# Patient Record
Sex: Male | Born: 1970 | Race: Black or African American | Hispanic: No | Marital: Married | Smoking: Never smoker
Health system: Southern US, Community
[De-identification: ages and names within clinical notes are randomized; demographics above are authoritative.]

## PROBLEM LIST (undated history)

## (undated) DIAGNOSIS — G4730 Sleep apnea, unspecified: Secondary | ICD-10-CM

## (undated) DIAGNOSIS — I10 Essential (primary) hypertension: Secondary | ICD-10-CM

---

## 2017-05-23 ENCOUNTER — Encounter: Payer: Self-pay | Admitting: *Deleted

## 2017-05-23 ENCOUNTER — Emergency Department
Admission: EM | Admit: 2017-05-23 | Discharge: 2017-05-23 | Disposition: A | Discharge status: Home / Self Care | Payer: Self-pay | Attending: Emergency Medicine | Admitting: Emergency Medicine

## 2017-05-23 ENCOUNTER — Emergency Department: Payer: Self-pay

## 2017-05-23 ENCOUNTER — Other Ambulatory Visit: Payer: Self-pay

## 2017-05-23 DIAGNOSIS — S82891A Other fracture of right lower leg, initial encounter for closed fracture: Secondary | ICD-10-CM

## 2017-05-23 DIAGNOSIS — Y9389 Activity, other specified: Secondary | ICD-10-CM | POA: Insufficient documentation

## 2017-05-23 DIAGNOSIS — Y999 Unspecified external cause status: Secondary | ICD-10-CM | POA: Insufficient documentation

## 2017-05-23 DIAGNOSIS — S82391A Other fracture of lower end of right tibia, initial encounter for closed fracture: Secondary | ICD-10-CM | POA: Insufficient documentation

## 2017-05-23 DIAGNOSIS — Y929 Unspecified place or not applicable: Secondary | ICD-10-CM | POA: Insufficient documentation

## 2017-05-23 DIAGNOSIS — W01198A Fall on same level from slipping, tripping and stumbling with subsequent striking against other object, initial encounter: Secondary | ICD-10-CM | POA: Insufficient documentation

## 2017-05-23 DIAGNOSIS — I10 Essential (primary) hypertension: Secondary | ICD-10-CM | POA: Insufficient documentation

## 2017-05-23 HISTORY — DX: Essential (primary) hypertension: I10

## 2017-05-23 MED ORDER — OXYCODONE-ACETAMINOPHEN 5-325 MG PO TABS
1.0000 | ORAL_TABLET | Freq: Once | ORAL | Status: AC
  Administered 2017-05-23: 1 via ORAL
  Filled 2017-05-23: qty 1

## 2017-05-23 MED ORDER — OXYCODONE-ACETAMINOPHEN 7.5-325 MG PO TABS: 1.0000 | ORAL_TABLET | Freq: Four times a day (QID) | ORAL | 0 refills | Status: DC | PRN

## 2017-05-23 MED ORDER — IBUPROFEN 600 MG PO TABS: 600.0000 mg | ORAL_TABLET | Freq: Three times a day (TID) | ORAL | 0 refills | Status: DC | PRN

## 2017-05-23 MED ORDER — IBUPROFEN 800 MG PO TABS
800.0000 mg | ORAL_TABLET | Freq: Once | ORAL | Status: AC
  Administered 2017-05-23: 800 mg via ORAL
  Filled 2017-05-23: qty 1

## 2017-05-23 NOTE — ED Notes (Signed)
Splinted by ED paramedic, fitted for crutches. Gait training.
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
## 2017-05-23 NOTE — Discharge Instructions (Signed)
Wear splint and ambulate with crutches until evaluation by orthopedics. Call today to schedule an appointment for tomorrow.

## 2017-05-24 ENCOUNTER — Encounter
Admission: RE | Admit: 2017-05-24 | Discharge: 2017-05-24 | Disposition: A | Discharge status: Home / Self Care | Payer: BLUE CROSS/BLUE SHIELD | Source: Ambulatory Visit | Attending: Orthopedic Surgery | Admitting: Orthopedic Surgery

## 2017-05-24 ENCOUNTER — Other Ambulatory Visit: Payer: Self-pay

## 2017-05-24 ENCOUNTER — Ambulatory Visit: Payer: Self-pay | Admitting: Orthopedic Surgery

## 2017-05-24 DIAGNOSIS — Z01810 Encounter for preprocedural cardiovascular examination: Secondary | ICD-10-CM | POA: Diagnosis present

## 2017-05-24 DIAGNOSIS — Z01812 Encounter for preprocedural laboratory examination: Secondary | ICD-10-CM | POA: Diagnosis present

## 2017-05-24 DIAGNOSIS — I10 Essential (primary) hypertension: Secondary | ICD-10-CM | POA: Diagnosis present

## 2017-05-24 HISTORY — DX: Sleep apnea, unspecified: G47.30

## 2017-05-24 LAB — CBC
HEMATOCRIT: 35.9 % — AB (ref 40.0–52.0)
HEMOGLOBIN: 12.2 g/dL — AB (ref 13.0–18.0)
MCH: 29.6 pg (ref 26.0–34.0)
MCHC: 33.9 g/dL (ref 32.0–36.0)
MCV: 87.5 fL (ref 80.0–100.0)
Platelets: 328 10*3/uL (ref 150–440)
RBC: 4.11 MIL/uL — AB (ref 4.40–5.90)
RDW: 15.1 % — ABNORMAL HIGH (ref 11.5–14.5)
WBC: 7.9 10*3/uL (ref 3.8–10.6)

## 2017-05-24 LAB — BASIC METABOLIC PANEL
ANION GAP: 6 (ref 5–15)
BUN: 13 mg/dL (ref 6–20)
CALCIUM: 8.5 mg/dL — AB (ref 8.9–10.3)
CO2: 27 mmol/L (ref 22–32)
Chloride: 108 mmol/L (ref 101–111)
Creatinine, Ser: 1.2 mg/dL (ref 0.61–1.24)
GLUCOSE: 107 mg/dL — AB (ref 65–99)
POTASSIUM: 3.1 mmol/L — AB (ref 3.5–5.1)
Sodium: 141 mmol/L (ref 135–145)

## 2017-05-24 MED ORDER — CEFAZOLIN SODIUM-DEXTROSE 2-4 GM/100ML-% IV SOLN
2.0000 g | INTRAVENOUS | Status: AC
Start: 2017-05-25 — End: 2017-05-25
  Administered 2017-05-25: 2 g via INTRAVENOUS

## 2017-05-24 MED ORDER — ACETAMINOPHEN 500 MG PO TABS: 1000.0000 mg | ORAL_TABLET | Freq: Once | ORAL | Status: DC

## 2017-05-24 NOTE — Patient Instructions (Addendum)
Your procedure is scheduled on: May 25, 2017 at 4 pm Report to [facility]nd [facility]   REMEMBER: Instructions that are not followed completely may result in serious medical risk up to and including death; or upon the discretion of your surgeon and anesthesiologist your surgery may need to be rescheduled.  Do not eat food or drink liquids after midnight. No gum chewing or hard candies.  You may however, drink CLEAR liquids up to 2 hours before you are scheduled to arrive at the hospital for your procedure.  Do not drink clear liquids within 2 hours of your scheduled arrival to the hospital as this may lead to your procedure being delayed or rescheduled.  Clear liquids include: - water  - apple juice without pulp - clear gatorade - black coffee or tea (NO milk, creamers, sugars) DO NOT drink anything not on this list.    No Alcohol for 24 hours before or after surgery.  No Smoking for 24 hours prior to surgery.  Notify your doctor if there is any change in your medical condition (cold, fever, infection).  Do not wear jewelry, make-up, hairpins, clips or nail polish.  Do not wear lotions, powders, or perfumes.   Do not shave 48 hours prior to surgery. Men may shave face and neck.  Contacts and dentures may not be worn into surgery.  Do not bring valuables to the hospital. [facility] is not responsible for any belongings or valuables.   TAKE THESE MEDICATIONS THE MORNING OF SURGERY WITH A SIP OF WATER: Amlodipine percocet    Use CHG Soap or wipes as directed on instruction sheet.   Bring your C-PAP to the hospital with you in case you may have to spend the night.days prior to surgery.   Follow recommendations from Cardiologist, Pulmonologist or PCP regarding stopping Aspirin, Coumadin, Plavix, Eliquis, Pradaxa, or Pletal.  Stop Anti-inflammatories such as Advil, Aleve, Ibuprofen, Motrin, Naproxen, Naprosyn, Goodie powder, or aspirin  products. (May take Tylenol or Acetaminophen and Celebrex if needed.)  Stop supplements until after surgery. (May continue Vitamin D, Vitamin B, and multivitamin.)  If you are being admitted to the hospital overnight, leave your suitcase in the car. After surgery it may be brought to your room.  If you are being discharged the day of surgery, you will not be allowed to drive home. You will need someone to drive you home and stay with you that night.   If you are taking public transportation, you will need to have a responsible adult to with you.  Please call the number above if you have any questions about these instructions.

## 2017-05-24 NOTE — OR Nursing (Signed)
Potassium results are 3.1 from today. Dr. Maxcine HamBower's office called and spoke with Henrine ScrewsSherry Morton and she reports she would notify Dr. Odis LusterBowers.

## 2017-05-25 ENCOUNTER — Ambulatory Visit: Payer: BLUE CROSS/BLUE SHIELD | Admitting: Anesthesiology

## 2017-05-25 ENCOUNTER — Ambulatory Visit
Admission: RE | Admit: 2017-05-25 | Discharge: 2017-05-25 | Disposition: A | Discharge status: Home / Self Care | Payer: BLUE CROSS/BLUE SHIELD | Source: Ambulatory Visit | Attending: Orthopedic Surgery | Admitting: Orthopedic Surgery

## 2017-05-25 ENCOUNTER — Encounter: Payer: Self-pay | Admitting: Anesthesiology

## 2017-05-25 ENCOUNTER — Encounter
Admission: RE | Disposition: A | Discharge status: Home / Self Care | Payer: Self-pay | Source: Ambulatory Visit | Attending: Orthopedic Surgery

## 2017-05-25 DIAGNOSIS — S93431A Sprain of tibiofibular ligament of right ankle, initial encounter: Secondary | ICD-10-CM | POA: Insufficient documentation

## 2017-05-25 DIAGNOSIS — Y939 Activity, unspecified: Secondary | ICD-10-CM | POA: Insufficient documentation

## 2017-05-25 DIAGNOSIS — Z7982 Long term (current) use of aspirin: Secondary | ICD-10-CM | POA: Diagnosis not present

## 2017-05-25 DIAGNOSIS — S82831A Other fracture of upper and lower end of right fibula, initial encounter for closed fracture: Secondary | ICD-10-CM | POA: Diagnosis not present

## 2017-05-25 DIAGNOSIS — X58XXXA Exposure to other specified factors, initial encounter: Secondary | ICD-10-CM | POA: Insufficient documentation

## 2017-05-25 DIAGNOSIS — Z79899 Other long term (current) drug therapy: Secondary | ICD-10-CM | POA: Insufficient documentation

## 2017-05-25 DIAGNOSIS — Y9355 Activity, bike riding: Secondary | ICD-10-CM | POA: Insufficient documentation

## 2017-05-25 DIAGNOSIS — S9301XA Subluxation of right ankle joint, initial encounter: Secondary | ICD-10-CM | POA: Insufficient documentation

## 2017-05-25 DIAGNOSIS — I10 Essential (primary) hypertension: Secondary | ICD-10-CM | POA: Diagnosis not present

## 2017-05-25 DIAGNOSIS — G4730 Sleep apnea, unspecified: Secondary | ICD-10-CM | POA: Diagnosis not present

## 2017-05-25 HISTORY — PX: ORIF ANKLE FRACTURE: SHX5408

## 2017-05-25 LAB — POCT I-STAT 4, (NA,K, GLUC, HGB,HCT)
Glucose, Bld: 81 mg/dL (ref 65–99)
HCT: 39 % (ref 39.0–52.0)
Hemoglobin: 13.3 g/dL (ref 13.0–17.0)
POTASSIUM: 3.5 mmol/L (ref 3.5–5.1)
SODIUM: 140 mmol/L (ref 135–145)

## 2017-05-25 LAB — URINE DRUG SCREEN, QUALITATIVE (ARMC ONLY)
Amphetamines, Ur Screen: NOT DETECTED
BARBITURATES, UR SCREEN: NOT DETECTED
BENZODIAZEPINE, UR SCRN: NOT DETECTED
COCAINE METABOLITE, UR ~~LOC~~: NOT DETECTED
Cannabinoid 50 Ng, Ur ~~LOC~~: POSITIVE — AB
MDMA (Ecstasy)Ur Screen: NOT DETECTED
METHADONE SCREEN, URINE: NOT DETECTED
Opiate, Ur Screen: NOT DETECTED
Phencyclidine (PCP) Ur S: NOT DETECTED
TRICYCLIC, UR SCREEN: NOT DETECTED

## 2017-05-25 SURGERY — OPEN REDUCTION INTERNAL FIXATION (ORIF) ANKLE FRACTURE
Anesthesia: General | Site: Ankle | Laterality: Right | Wound class: Clean

## 2017-05-25 MED ORDER — GABAPENTIN 300 MG PO CAPS
ORAL_CAPSULE | ORAL | Status: AC
  Administered 2017-05-25: 300 mg via ORAL
  Filled 2017-05-25: qty 1

## 2017-05-25 MED ORDER — DEXAMETHASONE SODIUM PHOSPHATE 10 MG/ML IJ SOLN
INTRAMUSCULAR | Status: AC
  Filled 2017-05-25: qty 1

## 2017-05-25 MED ORDER — MIDAZOLAM HCL 2 MG/2ML IJ SOLN
INTRAMUSCULAR | Status: AC
  Filled 2017-05-25: qty 2

## 2017-05-25 MED ORDER — FENTANYL CITRATE (PF) 100 MCG/2ML IJ SOLN
INTRAMUSCULAR | Status: DC | PRN
  Administered 2017-05-25: 50 ug via INTRAVENOUS
  Administered 2017-05-25: 100 ug via INTRAVENOUS
  Administered 2017-05-25 (×3): 50 ug via INTRAVENOUS

## 2017-05-25 MED ORDER — FENTANYL CITRATE (PF) 100 MCG/2ML IJ SOLN
INTRAMUSCULAR | Status: AC
  Administered 2017-05-25: 50 ug via INTRAVENOUS
  Filled 2017-05-25: qty 2

## 2017-05-25 MED ORDER — FAMOTIDINE 20 MG PO TABS
ORAL_TABLET | ORAL | Status: AC
  Administered 2017-05-25: 20 mg via ORAL
  Filled 2017-05-25: qty 1

## 2017-05-25 MED ORDER — ACETAMINOPHEN 10 MG/ML IV SOLN
INTRAVENOUS | Status: AC
  Filled 2017-05-25: qty 100

## 2017-05-25 MED ORDER — LACTATED RINGERS IV SOLN
INTRAVENOUS | Status: DC
  Administered 2017-05-25 (×2): via INTRAVENOUS

## 2017-05-25 MED ORDER — OXYCODONE-ACETAMINOPHEN 7.5-325 MG PO TABS: 1.0000 | ORAL_TABLET | Freq: Four times a day (QID) | ORAL | 0 refills | Status: AC | PRN

## 2017-05-25 MED ORDER — IBUPROFEN 600 MG PO TABS: 600.0000 mg | ORAL_TABLET | Freq: Three times a day (TID) | ORAL | 0 refills | Status: AC | PRN

## 2017-05-25 MED ORDER — CHLORHEXIDINE GLUCONATE 4 % EX LIQD: 60.0000 mL | Freq: Once | CUTANEOUS | Status: DC

## 2017-05-25 MED ORDER — PROPOFOL 10 MG/ML IV BOLUS
INTRAVENOUS | Status: DC | PRN
  Administered 2017-05-25: 230 mg via INTRAVENOUS

## 2017-05-25 MED ORDER — FAMOTIDINE 20 MG PO TABS
20.0000 mg | ORAL_TABLET | Freq: Once | ORAL | Status: AC
  Administered 2017-05-25: 20 mg via ORAL

## 2017-05-25 MED ORDER — ACETAMINOPHEN 500 MG PO TABS
1000.0000 mg | ORAL_TABLET | Freq: Once | ORAL | Status: AC
  Administered 2017-05-25: 1000 mg via ORAL

## 2017-05-25 MED ORDER — FENTANYL CITRATE (PF) 100 MCG/2ML IJ SOLN
INTRAMUSCULAR | Status: AC
  Filled 2017-05-25: qty 2

## 2017-05-25 MED ORDER — DEXAMETHASONE SODIUM PHOSPHATE 10 MG/ML IJ SOLN
INTRAMUSCULAR | Status: DC | PRN
  Administered 2017-05-25: 10 mg via INTRAVENOUS

## 2017-05-25 MED ORDER — BUPIVACAINE-EPINEPHRINE 0.25% -1:200000 IJ SOLN
INTRAMUSCULAR | Status: DC | PRN
  Administered 2017-05-25: 15 mL

## 2017-05-25 MED ORDER — SODIUM CHLORIDE 0.9 % IR SOLN
Status: DC | PRN
  Administered 2017-05-25: 100 mL

## 2017-05-25 MED ORDER — GABAPENTIN 300 MG PO CAPS
300.0000 mg | ORAL_CAPSULE | Freq: Once | ORAL | Status: AC
  Administered 2017-05-25: 300 mg via ORAL

## 2017-05-25 MED ORDER — OXYCODONE HCL 5 MG PO TABS: 5.0000 mg | ORAL_TABLET | Freq: Once | ORAL | Status: DC | PRN

## 2017-05-25 MED ORDER — BACITRACIN 50000 UNITS IM SOLR
INTRAMUSCULAR | Status: AC
  Filled 2017-05-25: qty 1

## 2017-05-25 MED ORDER — ONDANSETRON HCL 4 MG/2ML IJ SOLN
INTRAMUSCULAR | Status: DC | PRN
  Administered 2017-05-25: 4 mg via INTRAVENOUS

## 2017-05-25 MED ORDER — SUCCINYLCHOLINE CHLORIDE 20 MG/ML IJ SOLN
INTRAMUSCULAR | Status: DC | PRN
  Administered 2017-05-25: 200 mg via INTRAVENOUS

## 2017-05-25 MED ORDER — CEFAZOLIN SODIUM-DEXTROSE 2-4 GM/100ML-% IV SOLN
INTRAVENOUS | Status: AC
  Filled 2017-05-25: qty 100

## 2017-05-25 MED ORDER — LABETALOL HCL 5 MG/ML IV SOLN
10.0000 mg | Freq: Once | INTRAVENOUS | Status: AC
  Administered 2017-05-25: 10 mg via INTRAVENOUS

## 2017-05-25 MED ORDER — OXYCODONE HCL 5 MG PO TABS
ORAL_TABLET | ORAL | Status: AC
  Filled 2017-05-25: qty 1

## 2017-05-25 MED ORDER — DOCUSATE SODIUM 100 MG PO CAPS: 100.0000 mg | ORAL_CAPSULE | Freq: Every day | ORAL | 2 refills | Status: AC | PRN

## 2017-05-25 MED ORDER — MIDAZOLAM HCL 2 MG/2ML IJ SOLN
INTRAMUSCULAR | Status: DC | PRN
  Administered 2017-05-25: 2 mg via INTRAVENOUS

## 2017-05-25 MED ORDER — LABETALOL HCL 5 MG/ML IV SOLN
INTRAVENOUS | Status: AC
  Administered 2017-05-25: 10 mg via INTRAVENOUS
  Filled 2017-05-25: qty 4

## 2017-05-25 MED ORDER — ACETAMINOPHEN 10 MG/ML IV SOLN
INTRAVENOUS | Status: DC | PRN
  Administered 2017-05-25: 1000 mg via INTRAVENOUS

## 2017-05-25 MED ORDER — OXYCODONE HCL 5 MG/5ML PO SOLN: 5.0000 mg | Freq: Once | ORAL | Status: DC | PRN

## 2017-05-25 MED ORDER — LIDOCAINE HCL (PF) 2 % IJ SOLN
INTRAMUSCULAR | Status: AC
  Filled 2017-05-25: qty 10

## 2017-05-25 MED ORDER — FENTANYL CITRATE (PF) 100 MCG/2ML IJ SOLN
25.0000 ug | INTRAMUSCULAR | Status: DC | PRN
  Administered 2017-05-25 (×3): 50 ug via INTRAVENOUS

## 2017-05-25 MED ORDER — LIDOCAINE HCL (CARDIAC) 20 MG/ML IV SOLN
INTRAVENOUS | Status: DC | PRN
  Administered 2017-05-25: 100 mg via INTRAVENOUS

## 2017-05-25 MED ORDER — ROCURONIUM BROMIDE 100 MG/10ML IV SOLN
INTRAVENOUS | Status: DC | PRN
  Administered 2017-05-25: 50 mg via INTRAVENOUS

## 2017-05-25 MED ORDER — ACETAMINOPHEN 500 MG PO TABS
ORAL_TABLET | ORAL | Status: AC
  Administered 2017-05-25: 1000 mg via ORAL
  Filled 2017-05-25: qty 2

## 2017-05-25 MED ORDER — SUGAMMADEX SODIUM 500 MG/5ML IV SOLN
INTRAVENOUS | Status: DC | PRN
  Administered 2017-05-25: 235.8 mg via INTRAVENOUS

## 2017-05-25 SURGICAL SUPPLY — 40 items
ARTHREX ×3 IMPLANT
BANDAGE ELASTIC 6 CLIP ST LF (GAUZE/BANDAGES/DRESSINGS) ×6 IMPLANT
BLADE SURG 10 STRL SS SAFETY (BLADE) ×3 IMPLANT
BLADE SURG 15 STRL SS SAFETY (BLADE) ×9 IMPLANT
BNDG COHESIVE 6X5 TAN STRL LF (GAUZE/BANDAGES/DRESSINGS) ×3 IMPLANT
BNDG ESMARK 6X12 TAN STRL LF (GAUZE/BANDAGES/DRESSINGS) ×3 IMPLANT
BRUSH SCRUB EZ  4% CHG (MISCELLANEOUS) ×2
BRUSH SCRUB EZ 4% CHG (MISCELLANEOUS) ×1 IMPLANT
CANISTER SUCT 1200ML W/VALVE (MISCELLANEOUS) ×3 IMPLANT
CHLORAPREP W/TINT 26ML (MISCELLANEOUS) ×6 IMPLANT
DRAPE FLUOR MINI C-ARM 54X84 (DRAPES) ×3 IMPLANT
DRAPE SHEET LG 3/4 BI-LAMINATE (DRAPES) ×3 IMPLANT
ELECT REM PT RETURN 9FT ADLT (ELECTROSURGICAL) ×3
ELECTRODE REM PT RTRN 9FT ADLT (ELECTROSURGICAL) ×1 IMPLANT
GAUZE PETRO XEROFOAM 1X8 (MISCELLANEOUS) ×6 IMPLANT
GLOVE INDICATOR 8.0 STRL GRN (GLOVE) ×9 IMPLANT
GLOVE SURG ORTHO 8.0 STRL STRW (GLOVE) ×9 IMPLANT
GOWN STRL REUS W/ TWL LRG LVL3 (GOWN DISPOSABLE) ×1 IMPLANT
GOWN STRL REUS W/ TWL XL LVL3 (GOWN DISPOSABLE) ×3 IMPLANT
GOWN STRL REUS W/TWL LRG LVL3 (GOWN DISPOSABLE) ×2
GOWN STRL REUS W/TWL XL LVL3 (GOWN DISPOSABLE) ×6
K-WIRE BB-TAK (WIRE) ×3
KIT RM TURNOVER STRD PROC AR (KITS) ×3 IMPLANT
KWIRE BB-TAK (WIRE) ×1 IMPLANT
NS IRRIG 1000ML POUR BTL (IV SOLUTION) ×3 IMPLANT
PACK EXTREMITY ARMC (MISCELLANEOUS) ×3 IMPLANT
PAD ABD DERMACEA PRESS 5X9 (GAUZE/BANDAGES/DRESSINGS) ×12 IMPLANT
PAD CAST CTTN 4X4 STRL (SOFTGOODS) ×2 IMPLANT
PADDING CAST COTTON 4X4 STRL (SOFTGOODS) ×4
PLATE 2 HOLE SS STRL (Plate) ×3 IMPLANT
REPAIR TROPE KNTLS SS SYNDESMO (Orthopedic Implant) ×6 IMPLANT
SPLINT CAST 1 STEP 5X30 WHT (MISCELLANEOUS) ×3 IMPLANT
STAPLER SKIN PROX 35W (STAPLE) ×3 IMPLANT
SUT VIC AB 2-0 SH 27 (SUTURE)
SUT VIC AB 2-0 SH 27XBRD (SUTURE) IMPLANT
SUT VIC AB 3-0 SH 27 (SUTURE) ×4
SUT VIC AB 3-0 SH 27X BRD (SUTURE) ×2 IMPLANT
TAPE SURG TRANSPORE 1 IN (GAUZE/BANDAGES/DRESSINGS) IMPLANT
TAPE SURGICAL TRANSPORE 1 IN (GAUZE/BANDAGES/DRESSINGS)
TOWEL OR 17X26 4PK STRL BLUE (TOWEL DISPOSABLE) ×3 IMPLANT

## 2017-05-25 NOTE — Anesthesia Post-op Follow-up Note (Signed)
Anesthesia QCDR form completed.        

## 2017-05-25 NOTE — Discharge Instructions (Signed)
Keep splint clean and DRY Call Dr. Odis LusterBowers' office at [phone] with any questions - fever or shortness of breath Follow-up as scheduled No weight on the right leg Elevate the right leg above the heart    AMBULATORY SURGERY  DISCHARGE INSTRUCTIONS   1) The drugs that you were given will stay in your system until tomorrow so for the next 24 hours you should not:  A) Drive an automobile B) Make any legal decisions C) Drink any alcoholic beverage   2) You may resume regular meals tomorrow.  Today it is better to start with liquids and gradually work up to solid foods.  You may eat anything you prefer, but it is better to start with liquids, then soup and crackers, and gradually work up to solid foods.   3) Please notify your doctor immediately if you have any unusual bleeding, trouble breathing, redness and pain at the surgery site, drainage, fever, or pain not relieved by medication.    4) Additional Instructions:        Please contact your physician with any problems or Same Day Surgery at [phone], Monday through Friday 6 am to 4 pm, or [place] at [facility] Main number at [phone].

## 2017-05-25 NOTE — Anesthesia Preprocedure Evaluation (Addendum)
Anesthesia Evaluation  Patient identified by MRN, date of birth, ID band Patient awake    Reviewed: Allergy & Precautions, H&P , NPO status , Patient's Chart, lab work & pertinent test results  History of Anesthesia Complications Negative for: history of anesthetic complications  Airway Mallampati: III  TM Distance: >3 FB Neck ROM: full    Dental  (+) Chipped, Poor Dentition   Pulmonary neg shortness of breath, sleep apnea and Continuous Positive Airway Pressure Ventilation ,           Cardiovascular Exercise Tolerance: Good hypertension, (-) angina(-) Past MI and (-) DOE      Neuro/Psych negative neurological ROS  negative psych ROS   GI/Hepatic negative GI ROS, Neg liver ROS,   Endo/Other  negative endocrine ROS  Renal/GU      Musculoskeletal   Abdominal   Peds  Hematology negative hematology ROS (+)   Anesthesia Other Findings Past Medical History: No date: Hypertension No date: Sleep apnea  History reviewed. No pertinent surgical history.  BMI    Body Mass Index:  36.26 kg/m      Reproductive/Obstetrics negative OB ROS                             Anesthesia Physical Anesthesia Plan  ASA: III  Anesthesia Plan: General ETT and General   Post-op Pain Management:    Induction: Intravenous  PONV Risk Score and Plan: 2 and Ondansetron, Dexamethasone, Midazolam and Treatment may vary due to age or medical condition  Airway Management Planned: Oral ETT  Additional Equipment:   Intra-op Plan:   Post-operative Plan: Extubation in OR  Informed Consent: I have reviewed the patients History and Physical, chart, labs and discussed the procedure including the risks, benefits and alternatives for the proposed anesthesia with the patient or authorized representative who has indicated his/her understanding and acceptance.   Dental Advisory Given  Plan Discussed with:  Anesthesiologist, CRNA and Surgeon  Anesthesia Plan Comments: (Patient consented for risks of anesthesia including but not limited to:  - adverse reactions to medications - damage to teeth, lips or other oral mucosa - sore throat or hoarseness - Damage to heart, brain, lungs or loss of life  Patient voiced understanding.)        Anesthesia Quick Evaluation

## 2017-05-25 NOTE — H&P (Signed)
PREOPERATIVE H&P  Chief Complaint: [id number] Sprain of tibiofibular ligament of right ankle, init encntr S82.401S Unspecified fracture of shaft of right fibula, sequela  HPI: Trevor Tyler is a 46 y.o. male who presents for preoperative history and physical with a diagnosis of S93.431A Sprain of tibiofibular ligament of right ankle, init encntr S82.401S Unspecified fracture of shaft of right fibula, sequela. Symptoms are rated as moderate to severe, and have been worsening.  This is significantly impairing activities of daily living.  He has elected for surgical management.   Past Medical History:  Diagnosis Date  . Hypertension   . Sleep apnea    History reviewed. No pertinent surgical history. Social History   Socioeconomic History  . Marital status: Married    Spouse name: None  . Number of children: None  . Years of education: None  . Highest education level: None  Social Needs  . Financial resource strain: None  . Food insecurity - worry: None  . Food insecurity - inability: None  . Transportation needs - medical: None  . Transportation needs - non-medical: None  Occupational History  . None  Tobacco Use  . Smoking status: Never Smoker  . Smokeless tobacco: Never Used  Substance and Sexual Activity  . Alcohol use: Yes    Alcohol/week: 7.2 oz    Types: 12 Cans of beer per week  . Drug use: Yes    Types: Marijuana    Comment: current user  . Sexual activity: Yes  Other Topics Concern  . None  Social History Narrative  . None   History reviewed. No pertinent family history. No Known Allergies Prior to Admission medications   Medication Sig Start Date End Date Taking? Authorizing Provider  amLODipine (NORVASC) 5 MG tablet Take 5 mg daily by mouth.   Yes [provider]  aspirin EC 81 MG tablet Take 81 mg daily by mouth.   Yes [provider]  hydrochlorothiazide (HYDRODIURIL) 25 MG tablet Take 25 mg daily by mouth.   Yes [provider]   ibuprofen (ADVIL,MOTRIN) 600 MG tablet Take 1 tablet (600 mg total) every 8 (eight) hours as needed by mouth. 05/23/17  Yes Joni ReiningSmith, Ronald K, PA-C  lisinopril (PRINIVIL,ZESTRIL) 40 MG tablet Take 40 mg daily by mouth.   Yes [provider]  oxyCODONE-acetaminophen (PERCOCET) 7.5-325 MG tablet Take 1 tablet every 6 (six) hours as needed by mouth for severe pain. 05/23/17  Yes Joni ReiningSmith, Ronald K, PA-C     Positive ROS: All other systems have been reviewed and were otherwise negative with the exception of those mentioned in the HPI and as above.  Physical Exam: General: Alert, no acute distress Cardiovascular: Regular rate and rhythm, no murmurs rubs or gallops.  No pedal edema Respiratory: Clear to auscultation bilaterally, no wheezes rales or rhonchi. No cyanosis, no use of accessory musculature GI: No organomegaly, abdomen is soft and non-tender nondistended with positive bowel sounds. Skin: Skin intact, no lesions within the operative field. Neurologic: Sensation intact distally Psychiatric: Patient is competent for consent with normal mood and affect Lymphatic: No axillary or cervical lymphadenopathy  MUSCULOSKELETAL: right lower extremity splint c/d/i, good cap refill, sensation light touch intact, ecchymosis and tenderness over proximal fibula, able to move toes  Assessment: S93.431A Sprain of tibiofibular ligament of right ankle, init encntr S82.401S Unspecified fracture of shaft of right fibula, sequela  Plan: Plan for Procedure(s): OPEN REDUCTION INTERNAL FIXATION (ORIF) ANKLE FRACTURE  I discussed the risks and benefits of surgery. The  risks include but are not limited to infection, bleeding requiring blood transfusion, nerve or blood vessel injury, joint stiffness or loss of motion, persistent pain, weakness or instability, malunion, nonunion and hardware failure and the need for further surgery. Medical risks include but are not limited to DVT and pulmonary embolism,  myocardial infarction, stroke, pneumonia, respiratory failure and death. Patient understood these risks and wished to proceed.   Lyndle HerrlichJames R Brazen Domangue, MD   05/25/2017 3:12 PM

## 2017-05-25 NOTE — Anesthesia Procedure Notes (Signed)
Procedure Name: Intubation Date/Time: 05/25/2017 4:23 PM Performed by: Michaele OfferSavage, Johanna Matto, CRNA Pre-anesthesia Checklist: Patient identified, Emergency Drugs available, Suction available, Patient being monitored and Timeout performed Patient Re-evaluated:Patient Re-evaluated prior to induction Oxygen Delivery Method: Circle system utilized Preoxygenation: Pre-oxygenation with 100% oxygen Induction Type: IV induction Ventilation: Mask ventilation without difficulty Laryngoscope Size: McGraph and 4 Grade View: Grade I Tube type: Oral Tube size: 7.5 mm Number of attempts: 1 Airway Equipment and Method: Stylet Placement Confirmation: ETT inserted through vocal cords under direct vision,  positive ETCO2 and breath sounds checked- equal and bilateral Secured at: 23 cm Tube secured with: Tape Dental Injury: Teeth and Oropharynx as per pre-operative assessment  Future Recommendations: Recommend- induction with short-acting agent, and alternative techniques readily available

## 2017-05-25 NOTE — Transfer of Care (Signed)
Immediate Anesthesia Transfer of Care Note  Patient: Trevor Tyler  Procedure(s) Performed: OPEN REDUCTION INTERNAL FIXATION (ORIF) ANKLE FRACTURE (Right Ankle)  Patient Location: PACU  Anesthesia Type:General  Level of Consciousness: awake, alert  and oriented  Airway & Oxygen Therapy: Patient Spontanous Breathing and Patient connected to face mask oxygen  Post-op Assessment: Report given to RN and Post -op Vital signs reviewed and stable  Post vital signs: Reviewed and stable  Last Vitals:  Vitals:   05/25/17 1513 05/25/17 1525  BP:  (!) 164/107  Pulse: 71   Resp: 18   Temp: 36.8 C     Last Pain:  Vitals:   05/25/17 1513  TempSrc: Tympanic  PainSc: 7          Complications: No apparent anesthesia complications

## 2017-05-25 NOTE — Op Note (Signed)
05/25/2017  PATIENT:  Trevor Tyler    PRE-OPERATIVE DIAGNOSIS:  [id number] Sprain of tibiofibular ligament of right ankle, init encntr S82.401S Unspecified fracture of shaft of right fibula, sequela  POST-OPERATIVE DIAGNOSIS:  Same  PROCEDURE:  OPEN REDUCTION INTERNAL FIXATION (ORIF) ANKLE FRACTURE  SURGEON:  Lyndle HerrlichJames R Anddy Wingert, MD  ASSIST: Altamese CabalMaurice Jones, PA-C  ANESTHESIA:   General  TOURNIQUET TIME: 24  MIN at 275 mmHg  PREOPERATIVE INDICATIONS:  Trevor PaulRodney D Franken is a  46 y.o. male with a diagnosis of S93.431A Sprain of tibiofibular ligament of right ankle, init encntr S82.401S Unspecified fracture of shaft of right fibula, sequela who elected for surgical management to minimize the risk for malunion and nonunion and post-traumatic arthritis.    The risks benefits and alternatives were discussed with the patient preoperatively including but not limited to the risks of infection, bleeding, nerve injury, cardiopulmonary complications, the need for revision surgery, the need for hardware removal, among others, and the patient was willing to proceed.  OPERATIVE IMPLANTS: Arthrex TightRope x 2 and 2 hole plate  OPERATIVE PROCEDURE: The patient was brought to the operating room and placed in the supine position. All bony prominences were padded. General anesthesia was administered. The lower extremity was prepped and draped in the usual sterile fashion. The leg was elevated and exsanguinated and the tourniquet was inflated. Time out was performed.   Incision was made over the distal fibula. An olive wire was placed throught the two hole plate and the position was verified with fluoroscopy. The ankle was placed in dorsiflexion and the syndesmosis was reduced and verified using the Fluoroscan. The TightRope was cinched in to place, followed by a second TightRope. The wound was irrigated and closed with vicryl sutures and staples.  The syndesmosis was stressed using live fluoroscopy and found to be  stable.   Sponge and needle counts were correct. The wounds were injected with local anesthetic. Sterile gauze was applied followed by a posterior splint. He was awakened and returned to the PACU in stable and satisfactory condition. There were no apparent complications.  Dola ArgyleJames R. Odis LusterBowers, MD

## 2017-05-26 ENCOUNTER — Encounter: Payer: Self-pay | Admitting: Orthopedic Surgery

## 2017-05-26 NOTE — Anesthesia Postprocedure Evaluation (Signed)
Anesthesia Post Note  Patient: Rebbeca PaulRodney D Cumpian  Procedure(s) Performed: OPEN REDUCTION INTERNAL FIXATION (ORIF) ANKLE FRACTURE (Right Ankle)  Patient location during evaluation: PACU Anesthesia Type: General Level of consciousness: awake and alert Pain management: pain level controlled Vital Signs Assessment: post-procedure vital signs reviewed and stable Respiratory status: spontaneous breathing, nonlabored ventilation, respiratory function stable and patient connected to nasal cannula oxygen Cardiovascular status: blood pressure returned to baseline and stable Postop Assessment: no apparent nausea or vomiting Anesthetic complications: no     Last Vitals:  Vitals:   05/25/17 1812 05/25/17 1833  BP: (!) 166/94 (!) 165/100  Pulse: 68   Resp:  19  Temp:    SpO2: 94% 100%    Last Pain:  Vitals:   05/25/17 1812  TempSrc:   PainSc: 9                  Joseph K Piscitello

## 2019-05-28 ENCOUNTER — Other Ambulatory Visit: Payer: Self-pay

## 2019-05-28 DIAGNOSIS — Z20822 Contact with and (suspected) exposure to covid-19: Secondary | ICD-10-CM

## 2019-05-29 LAB — NOVEL CORONAVIRUS, NAA: SARS-CoV-2, NAA: NOT DETECTED
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
